# Patient Record
Sex: Female | Born: 1960 | Race: Black or African American | Hispanic: No | Marital: Married | Smoking: Never smoker
Health system: Southern US, Community
[De-identification: ages and names within clinical notes are randomized; demographics above are authoritative.]

## PROBLEM LIST (undated history)

## (undated) DIAGNOSIS — I10 Essential (primary) hypertension: Secondary | ICD-10-CM

## (undated) DIAGNOSIS — M1990 Unspecified osteoarthritis, unspecified site: Secondary | ICD-10-CM

## (undated) HISTORY — PX: ECTOPIC PREGNANCY SURGERY: SHX613

## (undated) HISTORY — PX: TONSILLECTOMY: SUR1361

## (undated) HISTORY — DX: Unspecified osteoarthritis, unspecified site: M19.90

## (undated) HISTORY — PX: TUBAL LIGATION: SHX77

---

## 1999-12-02 ENCOUNTER — Encounter: Admission: RE | Admit: 1999-12-02 | Discharge: 1999-12-02 | Payer: Self-pay | Admitting: Obstetrics & Gynecology

## 2000-01-09 ENCOUNTER — Ambulatory Visit (HOSPITAL_COMMUNITY): Admission: RE | Admit: 2000-01-09 | Discharge: 2000-01-09 | Payer: Self-pay | Admitting: Obstetrics & Gynecology

## 2000-10-20 ENCOUNTER — Encounter: Payer: Self-pay | Admitting: Infectious Diseases

## 2000-10-20 ENCOUNTER — Ambulatory Visit (HOSPITAL_COMMUNITY): Admission: RE | Admit: 2000-10-20 | Discharge: 2000-10-20 | Payer: Self-pay | Admitting: Infectious Diseases

## 2011-08-21 ENCOUNTER — Other Ambulatory Visit (HOSPITAL_COMMUNITY)
Admission: RE | Admit: 2011-08-21 | Discharge: 2011-08-21 | Disposition: A | Discharge status: Home / Self Care | Payer: PRIVATE HEALTH INSURANCE | Source: Ambulatory Visit | Attending: Family Medicine | Admitting: Family Medicine

## 2011-08-21 ENCOUNTER — Other Ambulatory Visit: Payer: Self-pay | Admitting: Family Medicine

## 2011-08-21 DIAGNOSIS — Z780 Asymptomatic menopausal state: Secondary | ICD-10-CM

## 2011-08-21 DIAGNOSIS — N644 Mastodynia: Secondary | ICD-10-CM

## 2011-08-21 DIAGNOSIS — Z0000 Encounter for general adult medical examination without abnormal findings: Secondary | ICD-10-CM | POA: Insufficient documentation

## 2012-11-07 ENCOUNTER — Encounter (HOSPITAL_COMMUNITY): Payer: Self-pay | Admitting: Emergency Medicine

## 2012-11-07 ENCOUNTER — Emergency Department (HOSPITAL_COMMUNITY)
Admission: EM | Admit: 2012-11-07 | Discharge: 2012-11-07 | Disposition: A | Discharge status: Home / Self Care | Payer: BC Managed Care – PPO | Attending: Emergency Medicine | Admitting: Emergency Medicine

## 2012-11-07 DIAGNOSIS — Z79899 Other long term (current) drug therapy: Secondary | ICD-10-CM | POA: Insufficient documentation

## 2012-11-07 DIAGNOSIS — T465X1A Poisoning by other antihypertensive drugs, accidental (unintentional), initial encounter: Secondary | ICD-10-CM | POA: Insufficient documentation

## 2012-11-07 DIAGNOSIS — T783XXA Angioneurotic edema, initial encounter: Secondary | ICD-10-CM

## 2012-11-07 DIAGNOSIS — T465X5A Adverse effect of other antihypertensive drugs, initial encounter: Secondary | ICD-10-CM | POA: Insufficient documentation

## 2012-11-07 DIAGNOSIS — I10 Essential (primary) hypertension: Secondary | ICD-10-CM | POA: Insufficient documentation

## 2012-11-07 HISTORY — DX: Essential (primary) hypertension: I10

## 2012-11-07 MED ORDER — HYDROCHLOROTHIAZIDE 12.5 MG PO TABS: 25.0000 mg | ORAL_TABLET | Freq: Every day | ORAL | Status: DC

## 2012-11-07 NOTE — ED Notes (Signed)
Pt in c/o lip swelling since this am, pt does take lisinopril, states she took benadryl earlier today with no relief, back of throat easily visualized at this time, pt denies tongue or throat swelling

## 2012-11-07 NOTE — ED Provider Notes (Signed)
CSN: [id number]     Arrival date & time 11/07/12  1644 History   First MD Initiated Contact with Patient 11/07/12 1647     Chief Complaint  Patient presents with  . Angioedema   (Consider location/radiation/quality/duration/timing/severity/associated sxs/prior Treatment) HPI This patient presents with painless swelling of the upper lip.  Symptoms began approximately 9 hours ago without clear precipitant.  Since onset symptoms have progressed to include the entire upper lip.  There is no associated dyspnea, foot pain or fullness.  No lightheadedness, syncope, no dysphagia. Patient has taken Benadryl with no relief. Patient took her lisinopril approximately 3 hours ago. No other complaints. No Hx of similar developments.   Past Medical History  Diagnosis Date  . Hypertension    History reviewed. No pertinent past surgical history. History reviewed. No pertinent family history. History  Substance Use Topics  . Smoking status: Never Smoker   . Smokeless tobacco: Not on file  . Alcohol Use: Not on file   OB History   Grav Para Term Preterm Abortions TAB SAB Ect Mult Living                 Review of Systems  Constitutional:       Per HPI, otherwise negative  HENT:       Per HPI, otherwise negative  Respiratory:       Per HPI, otherwise negative  Cardiovascular:       Per HPI, otherwise negative  Gastrointestinal: Negative for nausea and vomiting.  Endocrine:       Negative aside from HPI  Genitourinary:       Neg aside from HPI   Musculoskeletal:       Per HPI, otherwise negative  Skin: Negative.   Neurological: Negative for syncope.    Allergies  Review of patient's allergies indicates no known allergies.  Home Medications  No current outpatient prescriptions on file. BP 133/80  Pulse 96  Temp(Src) 98 F (36.7 C) (Oral)  Resp 18  SpO2 99% Physical Exam  Nursing note and vitals reviewed. Constitutional: She is oriented to person, place, and time. She  appears well-developed and well-nourished. No distress.  HENT:  Head: Normocephalic and atraumatic.  Mouth/Throat: Uvula is midline and oropharynx is clear and moist. No oral lesions. No trismus in the jaw. No dental abscesses, uvula swelling or lacerations. No oropharyngeal exudate, posterior oropharyngeal edema, posterior oropharyngeal erythema or tonsillar abscesses.    Eyes: Conjunctivae and EOM are normal.  Neck: Neck supple. No rigidity. No tracheal deviation, no edema, no erythema and normal range of motion present.  Cardiovascular: Normal rate and regular rhythm.   Pulmonary/Chest: Effort normal and breath sounds normal. No stridor. No respiratory distress.  Abdominal: She exhibits no distension.  Musculoskeletal: She exhibits no edema.  Lymphadenopathy:       Right cervical: No superficial cervical and no deep cervical adenopathy present.      Left cervical: No superficial cervical and no deep cervical adenopathy present.  Neurological: She is alert and oriented to person, place, and time. No cranial nerve deficit.  Skin: Skin is warm and dry.  Psychiatric: She has a normal mood and affect.    ED Course  Procedures (including critical care time) Labs Review Labs Reviewed - No data to display Imaging Review No results found.  EKG Interpretation   None      O2- 99%ra, normal MDM   1. Angioedema of lips, initial encounter    Patient presents with hours of edema  in her upper lip, with no respiratory complaints.  The patient is a taking lisinopril.  Patient has no history of prior event, but given her presentation, this event is most consistent with ACE inhibitor related angioedema. Given the passage of the hours since the onset of symptoms and with no decompensation, no throat complaints, no dyspnea, there is low suspicion for impending respiratory compromise.  Explicit return precautions were provided. Patient was counseled on the necessity to stop this medication, to  followup with her primary care physician for discussion of new appropriate antihypertensives, and she was discharged in stable condition.     Gerhard Munch, MD 11/07/12 [phone]

## 2012-12-25 ENCOUNTER — Encounter (HOSPITAL_COMMUNITY): Payer: Self-pay | Admitting: Emergency Medicine

## 2012-12-25 ENCOUNTER — Emergency Department (HOSPITAL_COMMUNITY)
Admission: EM | Admit: 2012-12-25 | Discharge: 2012-12-25 | Disposition: A | Discharge status: Home / Self Care | Payer: BC Managed Care – PPO | Attending: Emergency Medicine | Admitting: Emergency Medicine

## 2012-12-25 DIAGNOSIS — R209 Unspecified disturbances of skin sensation: Secondary | ICD-10-CM | POA: Insufficient documentation

## 2012-12-25 DIAGNOSIS — Z79899 Other long term (current) drug therapy: Secondary | ICD-10-CM | POA: Insufficient documentation

## 2012-12-25 DIAGNOSIS — R200 Anesthesia of skin: Secondary | ICD-10-CM

## 2012-12-25 DIAGNOSIS — I10 Essential (primary) hypertension: Secondary | ICD-10-CM | POA: Insufficient documentation

## 2012-12-25 DIAGNOSIS — Z87891 Personal history of nicotine dependence: Secondary | ICD-10-CM | POA: Insufficient documentation

## 2012-12-25 LAB — POCT I-STAT, CHEM 8
BUN: 12 mg/dL (ref 6–23)
Calcium, Ion: 1.2 mmol/L (ref 1.12–1.23)
Chloride: 101 mEq/L (ref 96–112)
Creatinine, Ser: 0.8 mg/dL (ref 0.50–1.10)
Glucose, Bld: 100 mg/dL — ABNORMAL HIGH (ref 70–99)
HCT: 40 % (ref 36.0–46.0)

## 2012-12-25 MED ORDER — ASPIRIN EC 81 MG PO TBEC: 81.0000 mg | DELAYED_RELEASE_TABLET | Freq: Every day | ORAL | Status: DC

## 2012-12-25 MED ORDER — HYDROCHLOROTHIAZIDE 25 MG PO TABS: 25.0000 mg | ORAL_TABLET | Freq: Every day | ORAL | Status: DC

## 2012-12-25 NOTE — ED Provider Notes (Signed)
CSN: [id number]     Arrival date & time 12/25/12  1200 History   First MD Initiated Contact with Patient 12/25/12 1214     Chief Complaint  Patient presents with  . Numbness   (Consider location/radiation/quality/duration/timing/severity/associated sxs/prior Treatment) HPI Comments: 52 y/o F comes in with cc if numbness, tingling. Pt states that around 10:30, she started having bilateral hands and leg numbness, and tingling. She took her BP, and it was elevated - in the 160s SBP - and her sx didn't improve, so she decided to come to the ER. The sx have now resolved. She has no hx of strokes, she doesn't smoke, and she denies any headaches, neck or back pain. No hx of similar sx, or any other unexplained focal neuro deficits in the past, and no hx of MS in the family.   The history is provided by the patient.    Past Medical History  Diagnosis Date  . Hypertension    Past Surgical History  Procedure Laterality Date  . Ectopic pregnancy surgery    . Tubal ligation     History reviewed. No pertinent family history. History  Substance Use Topics  . Smoking status: Former Smoker    Quit date: 12/26/1978  . Smokeless tobacco: Never Used  . Alcohol Use: Yes     Comment: ocassionally   OB History   Grav Para Term Preterm Abortions TAB SAB Ect Mult Living                 Review of Systems  Constitutional: Negative for chills and activity change.  HENT: Negative for facial swelling.   Eyes: Negative for visual disturbance.  Respiratory: Negative for cough, shortness of breath and wheezing.   Cardiovascular: Negative for chest pain.  Gastrointestinal: Negative for nausea, vomiting, abdominal pain, diarrhea and abdominal distention.  Genitourinary: Negative for dysuria and difficulty urinating.  Musculoskeletal: Negative for neck pain.  Skin: Negative for color change.  Neurological: Positive for numbness. Negative for dizziness, facial asymmetry, speech difficulty, weakness,  light-headedness and headaches.  Hematological: Does not bruise/bleed easily.  Psychiatric/Behavioral: Negative for confusion.    Allergies  Codeine and Lisinopril  Home Medications   Current Outpatient Rx  Name  Route  Sig  Dispense  Refill  . Ascorbic Acid (VITAMIN C) 1000 MG tablet   Oral   Take 1,000 mg by mouth daily.         . diphenhydrAMINE (BENADRYL) 25 mg capsule   Oral   Take 50 mg by mouth every 6 (six) hours as needed.         Marland Kitchen ECHINACEA PO   Oral   Take by mouth.         Marland Kitchen echothiophate 0.125 % ophthalmic solution      1 drop.         . hydrochlorothiazide (HYDRODIURIL) 12.5 MG tablet   Oral   Take 12.5 mg by mouth daily.         Marland Kitchen ibuprofen (ADVIL,MOTRIN) 200 MG tablet   Oral   Take 200 mg by mouth every 6 (six) hours as needed for fever, headache or mild pain.         . Multiple Vitamins-Minerals (MULTIVITAMIN PO)   Oral   Take 1 tablet by mouth daily.          BP 158/99  Pulse 91  Temp(Src) 98.4 F (36.9 C) (Oral)  Resp 16  SpO2 100% Physical Exam  Nursing note and vitals reviewed. Constitutional: She  is oriented to person, place, and time. She appears well-developed and well-nourished.  HENT:  Head: Normocephalic and atraumatic.  Eyes: EOM are normal. Pupils are equal, round, and reactive to light.  Neck: Neck supple.  Cardiovascular: Normal rate, regular rhythm and normal heart sounds.   No murmur heard. Pulmonary/Chest: Effort normal. No respiratory distress.  Abdominal: Soft. She exhibits no distension. There is no tenderness. There is no rebound and no guarding.  Neurological: She is alert and oriented to person, place, and time.  Cerebellar exam is normal (finger to nose) Sensory exam normal for bilateral upper and lower extremities - and patient is able to discriminate between sharp and dull. Motor exam is 4+/5   Skin: Skin is warm and dry.    ED Course  Procedures (including critical care time) Labs  Review Labs Reviewed  POCT I-STAT, CHEM 8 - Abnormal; Notable for the following:    Glucose, Bld 100 (*)    All other components within normal limits   Imaging Review No results found.  EKG Interpretation   None       MDM  No diagnosis found.  Pt comes in with bilateral numbness. She has hx of HTN, but no other cardiovascular risk factors. Her sx were bilateral, episodic, and she ha no headache, focal neuro defictis, neck pain/back pain. Unsure what the cause of her sx are. I screen for MS on hx - and she denies any vague neuro complains in the past. Will d/c with pcp f/u. Chem 8 ordered  - and there is no elyte abn.   Derwood Kaplan, MD 12/25/12 1420

## 2012-12-25 NOTE — ED Notes (Signed)
Pt presents to ED with numbness and tingling in hands and fingers after being without BP medications for one week.  Pt reports BP of 160/100 at home but went to work; pt had some nausea initially but no vomiting/ pt ate some peanut butter and nausea symptoms went away.

## 2013-03-22 ENCOUNTER — Emergency Department (HOSPITAL_COMMUNITY)
Admission: EM | Admit: 2013-03-22 | Discharge: 2013-03-22 | Disposition: A | Discharge status: Home / Self Care | Payer: BC Managed Care – PPO | Source: Home / Self Care | Attending: Family Medicine | Admitting: Family Medicine

## 2013-03-22 ENCOUNTER — Encounter (HOSPITAL_COMMUNITY): Payer: Self-pay | Admitting: Emergency Medicine

## 2013-03-22 DIAGNOSIS — I10 Essential (primary) hypertension: Secondary | ICD-10-CM

## 2013-03-22 MED ORDER — HYDROCHLOROTHIAZIDE 25 MG PO TABS: 25.0000 mg | ORAL_TABLET | Freq: Every day | ORAL | Status: DC

## 2013-03-22 NOTE — ED Notes (Signed)
Record not available, registration in progress

## 2013-03-22 NOTE — ED Provider Notes (Signed)
CSN: [id number]     Arrival date & time 03/22/13  0902 History   First MD Initiated Contact with Patient 03/22/13 [phone]     Chief Complaint  Patient presents with  . Hypertension  . Medication Refill   (Consider location/radiation/quality/duration/timing/severity/associated sxs/prior Treatment) HPI Comments: Hs recently run out of her HCTZ and she cannot afford to see her PCP for refills. Presents to [facility] requesting Rx refill. Reports herself to be currently asymptomatic.   Patient is a 53 y.o. female presenting with hypertension. The history is provided by the patient.  Hypertension    Past Medical History  Diagnosis Date  . Hypertension    Past Surgical History  Procedure Laterality Date  . Ectopic pregnancy surgery    . Tubal ligation     No family history on file. History  Substance Use Topics  . Smoking status: Former Smoker    Quit date: 12/26/1978  . Smokeless tobacco: Never Used  . Alcohol Use: Yes     Comment: ocassionally   OB History   Grav Para Term Preterm Abortions TAB SAB Ect Mult Living                 Review of Systems  All other systems reviewed and are negative.    Allergies  Codeine and Lisinopril  Home Medications   Current Outpatient Rx  Name  Route  Sig  Dispense  Refill  . hydrochlorothiazide (HYDRODIURIL) 25 MG tablet   Oral   Take 1 tablet (25 mg total) by mouth daily.   30 tablet   1   . Ascorbic Acid (VITAMIN C) 1000 MG tablet   Oral   Take 1,000 mg by mouth daily.         Marland Kitchen aspirin EC 81 MG tablet   Oral   Take 1 tablet (81 mg total) by mouth daily.   90 tablet   1   . diphenhydrAMINE (BENADRYL) 25 mg capsule   Oral   Take 50 mg by mouth every 6 (six) hours as needed.         Marland Kitchen ECHINACEA PO   Oral   Take by mouth.         Marland Kitchen echothiophate 0.125 % ophthalmic solution      1 drop.         . hydrochlorothiazide (HYDRODIURIL) 12.5 MG tablet   Oral   Take 12.5 mg by mouth daily.         .  hydrochlorothiazide (HYDRODIURIL) 25 MG tablet   Oral   Take 1 tablet (25 mg total) by mouth daily.   30 tablet   1   . ibuprofen (ADVIL,MOTRIN) 200 MG tablet   Oral   Take 200 mg by mouth every 6 (six) hours as needed for fever, headache or mild pain.         . Multiple Vitamins-Minerals (MULTIVITAMIN PO)   Oral   Take 1 tablet by mouth daily.          BP 161/79  Pulse 74  Temp(Src) 97.4 F (36.3 C) (Oral)  Resp 20  SpO2 100% Physical Exam  Nursing note and vitals reviewed. Constitutional: She is oriented to person, place, and time. She appears well-developed and well-nourished. No distress.  HENT:  Head: Normocephalic and atraumatic.  Eyes: Conjunctivae are normal. No scleral icterus.  Neck: Normal range of motion. Neck supple. No JVD present.  Cardiovascular: Normal rate, regular rhythm and normal heart sounds.   Pulmonary/Chest: Effort normal and breath  sounds normal.  Musculoskeletal: She exhibits no edema.  Neurological: She is alert and oriented to person, place, and time.  Skin: Skin is warm and dry.  Psychiatric: She has a normal mood and affect. Her behavior is normal.    ED Course  Procedures (including critical care time) Labs Review Labs Reviewed - No data to display Imaging Review No results found.   MDM   1. Hypertension    Limited Rx refill provided. Advised PCP follow up when possible.   Jess BartersJennifer Lee [place], [place] 03/22/13 [phone]

## 2013-03-22 NOTE — ED Notes (Signed)
Patient has history of htn.  Running out of medicine and has not taken medicine every day.  Last medicine taken was on Thursday, no medicine on Friday or today

## 2013-03-22 NOTE — Discharge Instructions (Signed)
Arterial Hypertension  Arterial hypertension (high blood pressure) is a condition of elevated pressure in your blood vessels. Hypertension over a long period of time is a risk factor for strokes, heart attacks, and heart failure. It is also the leading cause of kidney (renal) failure.   CAUSES   · In Adults -- Over 90% of all hypertension has no known cause. This is called essential or primary hypertension. In the other 10% of people with hypertension, the increase in blood pressure is caused by another disorder. This is called secondary hypertension. Important causes of secondary hypertension are:  · Heavy alcohol use.  · Obstructive sleep apnea.  · Hyperaldosterosim (Conn's syndrome).  · Steroid use.  · Chronic kidney failure.  · Hyperparathyroidism.  · Medications.  · Renal artery stenosis.  · Pheochromocytoma.  · Cushing's disease.  · Coarctation of the aorta.  · Scleroderma renal crisis.  · Licorice (in excessive amounts).  · Drugs (cocaine, methamphetamine).  Your caregiver can explain any items above that apply to you.  · In Children -- Secondary hypertension is more common and should always be considered.  · Pregnancy -- Few women of childbearing age have high blood pressure. However, up to 10% of them develop hypertension of pregnancy. Generally, this will not harm the woman. It may be a sign of 3 complications of pregnancy: preeclampsia, HELLP syndrome, and eclampsia. Follow up and control with medication is necessary.  SYMPTOMS   · This condition normally does not produce any noticeable symptoms. It is usually found during a routine exam.  · Malignant hypertension is a late problem of high blood pressure. It may have the following symptoms:  · Headaches.  · Blurred vision.  · End-organ damage (this means your kidneys, heart, lungs, and other organs are being damaged).  · Stressful situations can increase the blood pressure. If a person with normal blood pressure has their blood pressure go up while being  seen by their caregiver, this is often termed "white coat hypertension." Its importance is not known. It may be related with eventually developing hypertension or complications of hypertension.  · Hypertension is often confused with mental tension, stress, and anxiety.  DIAGNOSIS   The diagnosis is made by 3 separate blood pressure measurements. They are taken at least 1 week apart from each other. If there is organ damage from hypertension, the diagnosis may be made without repeat measurements.  Hypertension is usually identified by having blood pressure readings:  · Above 140/90 mmHg measured in both arms, at 3 separate times, over a couple weeks.  · Over 130/80 mmHg should be considered a risk factor and may require treatment in patients with diabetes.  Blood pressure readings over 120/80 mmHg are called "pre-hypertension" even in non-diabetic patients.  To get a true blood pressure measurement, use the following guidelines. Be aware of the factors that can alter blood pressure readings.  · Take measurements at least 1 hour after caffeine.  · Take measurements 30 minutes after smoking and without any stress. This is another reason to quit smoking  it raises your blood pressure.  · Use a proper cuff size. Ask your caregiver if you are not sure about your cuff size.  · Most home blood pressure cuffs are automatic. They will measure systolic and diastolic pressures. The systolic pressure is the pressure reading at the start of sounds. Diastolic pressure is the pressure at which the sounds disappear. If you are elderly, measure pressures in multiple postures. Try sitting, lying or   standing.  · Sit at rest for a minimum of 5 minutes before taking measurements.  · You should not be on any medications like decongestants. These are found in many cold medications.  · Record your blood pressure readings and review them with your caregiver.  If you have hypertension:  · Your caregiver may do tests to be sure you do not have  secondary hypertension (see "causes" above).  · Your caregiver may also look for signs of metabolic syndrome. This is also called Syndrome X or Insulin Resistance Syndrome. You may have this syndrome if you have type 2 diabetes, abdominal obesity, and abnormal blood lipids in addition to hypertension.  · Your caregiver will take your medical and family history and perform a physical exam.  · Diagnostic tests may include blood tests (for glucose, cholesterol, potassium, and kidney function), a urinalysis, or an EKG. Other tests may also be necessary depending on your condition.  PREVENTION   There are important lifestyle issues that you can adopt to reduce your chance of developing hypertension:  · Maintain a normal weight.  · Limit the amount of salt (sodium) in your diet.  · Exercise often.  · Limit alcohol intake.  · Get enough potassium in your diet. Discuss specific advice with your caregiver.  · Follow a DASH diet (dietary approaches to stop hypertension). This diet is rich in fruits, vegetables, and low-fat dairy products, and avoids certain fats.  PROGNOSIS   Essential hypertension cannot be cured. Lifestyle changes and medical treatment can lower blood pressure and reduce complications. The prognosis of secondary hypertension depends on the underlying cause. Many people whose hypertension is controlled with medicine or lifestyle changes can live a normal, healthy life.   RISKS AND COMPLICATIONS   While high blood pressure alone is not an illness, it often requires treatment due to its short- and long-term effects on many organs. Hypertension increases your risk for:  · CVAs or strokes (cerebrovascular accident).  · Heart failure due to chronically high blood pressure (hypertensive cardiomyopathy).  · Heart attack (myocardial infarction).  · Damage to the retina (hypertensive retinopathy).  · Kidney failure (hypertensive nephropathy).  Your caregiver can explain list items above that apply to you. Treatment  of hypertension can significantly reduce the risk of complications.  TREATMENT   · For overweight patients, weight loss and regular exercise are recommended. Physical fitness lowers blood pressure.  · Mild hypertension is usually treated with diet and exercise. A diet rich in fruits and vegetables, fat-free dairy products, and foods low in fat and salt (sodium) can help lower blood pressure. Decreasing salt intake decreases blood pressure in a 1/3 of people.  · Stop smoking if you are a smoker.  The steps above are highly effective in reducing blood pressure. While these actions are easy to suggest, they are difficult to achieve. Most patients with moderate or severe hypertension end up requiring medications to bring their blood pressure down to a normal level. There are several classes of medications for treatment. Blood pressure pills (antihypertensives) will lower blood pressure by their different actions. Lowering the blood pressure by 10 mmHg may decrease the risk of complications by as much as 25%.  The goal of treatment is effective blood pressure control. This will reduce your risk for complications. Your caregiver will help you determine the best treatment for you according to your lifestyle. What is excellent treatment for one person, may not be for you.  HOME CARE INSTRUCTIONS   · Do not   smoke.  · Follow the lifestyle changes outlined in the "Prevention" section.  · If you are on medications, follow the directions carefully. Blood pressure medications must be taken as prescribed. Skipping doses reduces their benefit. It also puts you at risk for problems.  · Follow up with your caregiver, as directed.  · If you are asked to monitor your blood pressure at home, follow the guidelines in the "Diagnosis" section above.  SEEK MEDICAL CARE IF:   · You think you are having medication side effects.  · You have recurrent headaches or lightheadedness.  · You have swelling in your ankles.  · You have trouble with  your vision.  SEEK IMMEDIATE MEDICAL CARE IF:   · You have sudden onset of chest pain or pressure, difficulty breathing, or other symptoms of a heart attack.  · You have a severe headache.  · You have symptoms of a stroke (such as sudden weakness, difficulty speaking, difficulty walking).  MAKE SURE YOU:   · Understand these instructions.  · Will watch your condition.  · Will get help right away if you are not doing well or get worse.  Document Released: 12/19/2004 Document Revised: 03/13/2011 Document Reviewed: 07/19/2006  ExitCare® Patient Information ©2014 [employer].  DASH Diet  The DASH diet stands for "Dietary Approaches to Stop Hypertension." It is a healthy eating plan that has been shown to reduce high blood pressure (hypertension) in as little as 14 days, while also possibly providing other significant health benefits. These other health benefits include reducing the risk of breast cancer after menopause and reducing the risk of type 2 diabetes, heart disease, colon cancer, and stroke. Health benefits also include weight loss and slowing kidney failure in patients with chronic kidney disease.   DIET GUIDELINES  · Limit salt (sodium). Your diet should contain less than 1500 mg of sodium daily.  · Limit refined or processed carbohydrates. Your diet should include mostly whole grains. Desserts and added sugars should be used sparingly.  · Include small amounts of heart-healthy fats. These types of fats include nuts, oils, and tub margarine. Limit saturated and trans fats. These fats have been shown to be harmful in the body.  CHOOSING FOODS   The following food groups are based on a 2000 calorie diet. See your Registered Dietitian for individual calorie needs.  Grains and Grain Products (6 to 8 servings daily)  · Eat More Often: Whole-wheat bread, brown rice, whole-grain or wheat pasta, quinoa, popcorn without added fat or salt (air popped).  · Eat Less Often: White bread, white pasta, white rice,  cornbread.  Vegetables (4 to 5 servings daily)  · Eat More Often: Fresh, frozen, and canned vegetables. Vegetables may be raw, steamed, roasted, or grilled with a minimal amount of fat.  · Eat Less Often/Avoid: Creamed or fried vegetables. Vegetables in a cheese sauce.  Fruit (4 to 5 servings daily)  · Eat More Often: All fresh, canned (in natural juice), or frozen fruits. Dried fruits without added sugar. One hundred percent fruit juice (½ cup [237 mL] daily).  · Eat Less Often: Dried fruits with added sugar. Canned fruit in light or heavy syrup.  Lean Meats, Fish, and Poultry (2 servings or less daily. One serving is 3 to 4 oz [85-114 g]).  · Eat More Often: Ninety percent or leaner ground beef, tenderloin, sirloin. Round cuts of beef, chicken breast, turkey breast. All fish. Grill, bake, or broil your meat. Nothing should be fried.  · Eat   Less Often/Avoid: Fatty cuts of meat, turkey, or chicken leg, thigh, or wing. Fried cuts of meat or fish.  Dairy (2 to 3 servings)  · Eat More Often: Low-fat or fat-free milk, low-fat plain or light yogurt, reduced-fat or part-skim cheese.  · Eat Less Often/Avoid: Milk (whole, 2%). Whole milk yogurt. Full-fat cheeses.  Nuts, Seeds, and Legumes (4 to 5 servings per week)  · Eat More Often: All without added salt.  · Eat Less Often/Avoid: Salted nuts and seeds, canned beans with added salt.  Fats and Sweets (limited)  · Eat More Often: Vegetable oils, tub margarines without trans fats, sugar-free gelatin. Mayonnaise and salad dressings.  · Eat Less Often/Avoid: Coconut oils, palm oils, butter, stick margarine, cream, half and half, cookies, candy, pie.  FOR MORE INFORMATION  The Dash Diet Eating Plan: www.dashdiet.org  Document Released: 12/08/2010 Document Revised: 03/13/2011 Document Reviewed: 12/08/2010  ExitCare® Patient Information ©2014 [employer].

## 2013-03-24 NOTE — ED Provider Notes (Signed)
Medical screening examination/treatment/procedure(s) were performed by a resident physician or non-physician practitioner and as the supervising physician I was immediately available for consultation/collaboration.  Aladdin Kollmann, MD    Ceilidh Torregrossa S Cherrelle Plante, MD 03/24/13 0805 

## 2018-01-11 DIAGNOSIS — M130 Polyarthritis, unspecified: Secondary | ICD-10-CM | POA: Diagnosis not present

## 2018-01-11 DIAGNOSIS — I10 Essential (primary) hypertension: Secondary | ICD-10-CM | POA: Diagnosis not present

## 2018-02-15 DIAGNOSIS — I10 Essential (primary) hypertension: Secondary | ICD-10-CM | POA: Diagnosis not present

## 2018-02-22 DIAGNOSIS — I10 Essential (primary) hypertension: Secondary | ICD-10-CM | POA: Diagnosis not present

## 2018-03-22 DIAGNOSIS — I10 Essential (primary) hypertension: Secondary | ICD-10-CM | POA: Diagnosis not present

## 2018-04-02 DIAGNOSIS — I10 Essential (primary) hypertension: Secondary | ICD-10-CM | POA: Diagnosis not present

## 2018-05-03 DIAGNOSIS — I10 Essential (primary) hypertension: Secondary | ICD-10-CM | POA: Diagnosis not present

## 2018-06-21 DIAGNOSIS — Z0131 Encounter for examination of blood pressure with abnormal findings: Secondary | ICD-10-CM | POA: Diagnosis not present

## 2018-07-12 DIAGNOSIS — I10 Essential (primary) hypertension: Secondary | ICD-10-CM | POA: Diagnosis not present

## 2018-08-26 ENCOUNTER — Other Ambulatory Visit: Payer: Self-pay

## 2018-08-26 DIAGNOSIS — Z20822 Contact with and (suspected) exposure to covid-19: Secondary | ICD-10-CM

## 2018-08-27 LAB — NOVEL CORONAVIRUS, NAA: SARS-CoV-2, NAA: NOT DETECTED

## 2018-10-11 DIAGNOSIS — Z1231 Encounter for screening mammogram for malignant neoplasm of breast: Secondary | ICD-10-CM | POA: Diagnosis not present

## 2018-10-18 DIAGNOSIS — I10 Essential (primary) hypertension: Secondary | ICD-10-CM | POA: Diagnosis not present

## 2018-10-18 DIAGNOSIS — M130 Polyarthritis, unspecified: Secondary | ICD-10-CM | POA: Diagnosis not present

## 2018-10-18 DIAGNOSIS — R635 Abnormal weight gain: Secondary | ICD-10-CM | POA: Diagnosis not present

## 2018-12-19 DIAGNOSIS — S161XXA Strain of muscle, fascia and tendon at neck level, initial encounter: Secondary | ICD-10-CM | POA: Diagnosis not present

## 2018-12-19 DIAGNOSIS — S39012A Strain of muscle, fascia and tendon of lower back, initial encounter: Secondary | ICD-10-CM | POA: Diagnosis not present

## 2018-12-19 DIAGNOSIS — M542 Cervicalgia: Secondary | ICD-10-CM | POA: Diagnosis not present

## 2019-01-02 DIAGNOSIS — S161XXD Strain of muscle, fascia and tendon at neck level, subsequent encounter: Secondary | ICD-10-CM | POA: Diagnosis not present

## 2019-01-02 DIAGNOSIS — S29012D Strain of muscle and tendon of back wall of thorax, subsequent encounter: Secondary | ICD-10-CM | POA: Diagnosis not present

## 2019-01-08 DIAGNOSIS — Z03818 Encounter for observation for suspected exposure to other biological agents ruled out: Secondary | ICD-10-CM | POA: Diagnosis not present

## 2019-01-15 DIAGNOSIS — S161XXD Strain of muscle, fascia and tendon at neck level, subsequent encounter: Secondary | ICD-10-CM | POA: Diagnosis not present

## 2019-01-15 DIAGNOSIS — S29012D Strain of muscle and tendon of back wall of thorax, subsequent encounter: Secondary | ICD-10-CM | POA: Diagnosis not present

## 2019-01-30 DIAGNOSIS — S161XXD Strain of muscle, fascia and tendon at neck level, subsequent encounter: Secondary | ICD-10-CM | POA: Diagnosis not present

## 2019-01-30 DIAGNOSIS — S29012D Strain of muscle and tendon of back wall of thorax, subsequent encounter: Secondary | ICD-10-CM | POA: Diagnosis not present

## 2019-02-10 DIAGNOSIS — Z1159 Encounter for screening for other viral diseases: Secondary | ICD-10-CM | POA: Diagnosis not present

## 2019-02-21 DIAGNOSIS — G4700 Insomnia, unspecified: Secondary | ICD-10-CM | POA: Diagnosis not present

## 2019-02-21 DIAGNOSIS — I10 Essential (primary) hypertension: Secondary | ICD-10-CM | POA: Diagnosis not present

## 2019-02-21 DIAGNOSIS — M130 Polyarthritis, unspecified: Secondary | ICD-10-CM | POA: Diagnosis not present

## 2019-03-06 DIAGNOSIS — S29012D Strain of muscle and tendon of back wall of thorax, subsequent encounter: Secondary | ICD-10-CM | POA: Diagnosis not present

## 2019-03-06 DIAGNOSIS — S161XXD Strain of muscle, fascia and tendon at neck level, subsequent encounter: Secondary | ICD-10-CM | POA: Diagnosis not present

## 2019-05-30 DIAGNOSIS — Z01419 Encounter for gynecological examination (general) (routine) without abnormal findings: Secondary | ICD-10-CM | POA: Diagnosis not present

## 2019-05-30 DIAGNOSIS — Z6828 Body mass index (BMI) 28.0-28.9, adult: Secondary | ICD-10-CM | POA: Diagnosis not present

## 2019-06-17 DIAGNOSIS — Z13820 Encounter for screening for osteoporosis: Secondary | ICD-10-CM | POA: Diagnosis not present

## 2019-06-17 DIAGNOSIS — R140 Abdominal distension (gaseous): Secondary | ICD-10-CM | POA: Diagnosis not present

## 2019-06-27 DIAGNOSIS — I10 Essential (primary) hypertension: Secondary | ICD-10-CM | POA: Diagnosis not present

## 2019-07-16 ENCOUNTER — Ambulatory Visit (INDEPENDENT_AMBULATORY_CARE_PROVIDER_SITE_OTHER): Payer: BC Managed Care – PPO | Admitting: Gastroenterology

## 2019-07-16 ENCOUNTER — Encounter: Payer: Self-pay | Admitting: Gastroenterology

## 2019-07-16 VITALS — BP 142/86 | HR 84 | Ht 63.5 in | Wt 167.0 lb

## 2019-07-16 DIAGNOSIS — R194 Change in bowel habit: Secondary | ICD-10-CM | POA: Insufficient documentation

## 2019-07-16 DIAGNOSIS — Z1211 Encounter for screening for malignant neoplasm of colon: Secondary | ICD-10-CM | POA: Diagnosis not present

## 2019-07-16 MED ORDER — SUTAB 1479-225-188 MG PO TABS: 1.0000 | ORAL_TABLET | Freq: Once | ORAL | 0 refills | Status: AC

## 2019-07-16 NOTE — Progress Notes (Signed)
07/16/2019 Jade Guzman [id number] 1960/06/23   HISTORY OF PRESENT ILLNESS: This is a 59 year old female who is new to our office.  She has been referred here by her GYN, Arlie Solomons, NP, for evaluation regarding abdominal pain, abdominal bloating, and "distended colon" seen on ultrasound.  She said that she went for her routine exam and was telling nurse practitioner about a lot of gas and generalized abdominal discomfort/bloating.  She says that over the past year she's had a change in bowel habits.  She says that she has normal, formed stools, but every time that she sits down to urinate she passes some stool.  Feels like she never completely empties her bowels all at one time.  Denies any sign of bleeding.  She says that they sent her for an ultrasound (we do not have records) and they reported a "distended colon".  She says that they recommended Metamucil and that she use it for short time and did seem to notice some change/improvement, but admits that she is not good about using stuff like that consistently for long periods of time.  She tells me that she had a colonoscopy with Dr. Randa Evens at [facility] about 6.5 to 7 years ago.  She was told that it was good and should not need another one for 10 years.    Past Medical History:  Diagnosis Date  . Arthritis   . Hypertension    Past Surgical History:  Procedure Laterality Date  . ECTOPIC PREGNANCY SURGERY    . TONSILLECTOMY    . TUBAL LIGATION      reports that she has never smoked. She has never used smokeless tobacco. She reports current alcohol use. She reports that she does not use drugs. family history includes Alcoholism in her mother; Breast cancer in her cousin; Drug abuse in her brother; Heart attack in her mother; Hypertension in her maternal grandfather, maternal grandmother, and mother; Sarcoidosis in her brother; Stroke in her mother. Allergies  Allergen Reactions  . Codeine Other (See Comments)    Hallucinations   . Lisinopril Swelling      Outpatient Encounter Medications as of 07/16/2019  Medication Sig  . amLODipine (NORVASC) 2.5 MG tablet Take 2.5 mg by mouth at bedtime.  . Ascorbic Acid (VITAMIN C) 1000 MG tablet Take 1,000 mg by mouth daily.  Marland Kitchen aspirin EC 81 MG tablet Take 1 tablet (81 mg total) by mouth daily.  Marland Kitchen ECHINACEA PO Take by mouth.  Marland Kitchen ibuprofen (ADVIL,MOTRIN) 200 MG tablet Take 200 mg by mouth every 6 (six) hours as needed for fever, headache or mild pain.  . indapamide (LOZOL) 1.25 MG tablet Take 1.25 mg by mouth daily.  . meloxicam (MOBIC) 15 MG tablet Take 1 tablet by mouth as needed.  . Multiple Vitamins-Minerals (MULTIVITAMIN PO) Take 1 tablet by mouth daily.  Maudry Mayhew AD 4-10-325 MG TABS Take 1 tablet by mouth as needed.  . [DISCONTINUED] diphenhydrAMINE (BENADRYL) 25 mg capsule Take 50 mg by mouth every 6 (six) hours as needed.  . [DISCONTINUED] echothiophate 0.125 % ophthalmic solution 1 drop.  . [DISCONTINUED] hydrochlorothiazide (HYDRODIURIL) 12.5 MG tablet Take 12.5 mg by mouth daily.  . [DISCONTINUED] hydrochlorothiazide (HYDRODIURIL) 25 MG tablet Take 1 tablet (25 mg total) by mouth daily.  . [DISCONTINUED] hydrochlorothiazide (HYDRODIURIL) 25 MG tablet Take 1 tablet (25 mg total) by mouth daily.   No facility-administered encounter medications on file as of 07/16/2019.     REVIEW OF SYSTEMS  :  All other systems reviewed and negative except where noted in the History of Present Illness.   PHYSICAL EXAM: BP (!) 142/86 (BP Location: Left Arm, Patient Position: Sitting, Cuff Size: Normal)   Pulse 84   Ht 5' 3.5" (1.613 m) Comment: height measured without shoes  Wt 167 lb (75.8 kg)   BMI 29.12 kg/m  General: Well developed AA female in no acute distress Head: Normocephalic and atraumatic Eyes:  Sclerae anicteric, conjunctiva pink. Ears: Normal auditory acuity Lungs: Clear throughout to auscultation; no increased WOB. Heart: Regular rate and rhythm; no  M/R/G. Abdomen: Soft, non-distended.  BS present.  Mild diffuse lower abdominal TTP. Rectal:  Will be done at the time of colonoscopy. Musculoskeletal: Symmetrical with no gross deformities  Skin: No lesions on visible extremities Extremities: No edema  Neurological: Alert oriented x 4, grossly non-focal Psychological:  Alert and cooperative. Normal mood and affect  ASSESSMENT AND PLAN: *59 year old female with complaints of generalized abdominal bloating/abdominal pain and gas with change in bowel habits over the past year.  Had an ultrasound performed per GYN that reported a "distended colon".  Over the past year has just developed more frequent bowel habits.  She says that she will pass stool each time she sits down to urinate.  Has what sounds like incomplete evacuation of stool.  She tried Metamucil powder for a short time and did notice a difference, but admits that she is not good about being consistent with stuff like that ongoingly.  I agree that that is a good idea to help create bulk to her stools to help eliminate more at one time.  Last colonoscopy 6.5 to 7 years ago by Dr. Randa Evens.  We will try to get those records, but I think that we need to plan for repeat procedure.  This is scheduled Dr. Adela Lank.  The risks, benefits, and alternatives to colonoscopy were discussed with the patient and she consents to proceed.   CC:  Cain Saupe, MD CC:  Arlie Solomons, NP,

## 2019-07-16 NOTE — Patient Instructions (Signed)
If you are age 59 or older, your body mass index should be between 23-30. Your Body mass index is 29.12 kg/m. If this is out of the aforementioned range listed, please consider follow up with your Primary Care Provider.  If you are age 60 or younger, your body mass index should be between 19-25. Your Body mass index is 29.12 kg/m. If this is out of the aformentioned range listed, please consider follow up with your Primary Care Provider.   You have been scheduled for a colonoscopy. Please follow written instructions given to you at your visit today.  Please pick up your prep supplies at the pharmacy within the next 1-3 days. If you use inhalers (even only as needed), please bring them with you on the day of your procedure. Your physician has requested that you go to www.startemmi.com and enter the access code given to you at your visit today. This web site gives a general overview about your procedure. However, you should still follow specific instructions given to you by our office regarding your preparation for the procedure.  Due to recent changes in healthcare laws, you may see the results of your imaging and laboratory studies on MyChart before your provider has had a chance to review them.  We understand that in some cases there may be results that are confusing or concerning to you. Not all laboratory results come back in the same time frame and the provider may be waiting for multiple results in order to interpret others.  Please give [place] 48 hours in order for your provider to thoroughly review all the results before contacting the office for clarification of your results.

## 2019-07-17 NOTE — Progress Notes (Signed)
Agree with assessment and plan as outlined.  

## 2019-08-04 ENCOUNTER — Encounter: Payer: Self-pay | Admitting: Gastroenterology

## 2019-08-06 ENCOUNTER — Encounter: Payer: Self-pay | Admitting: Gastroenterology

## 2019-08-06 ENCOUNTER — Ambulatory Visit (AMBULATORY_SURGERY_CENTER): Payer: BC Managed Care – PPO | Admitting: Gastroenterology

## 2019-08-06 ENCOUNTER — Other Ambulatory Visit: Payer: Self-pay

## 2019-08-06 VITALS — BP 155/87 | HR 86 | Temp 97.1°F | Resp 27 | Ht 63.0 in | Wt 167.0 lb

## 2019-08-06 DIAGNOSIS — Z1211 Encounter for screening for malignant neoplasm of colon: Secondary | ICD-10-CM | POA: Diagnosis not present

## 2019-08-06 DIAGNOSIS — K5730 Diverticulosis of large intestine without perforation or abscess without bleeding: Secondary | ICD-10-CM

## 2019-08-06 DIAGNOSIS — R194 Change in bowel habit: Secondary | ICD-10-CM

## 2019-08-06 MED ORDER — SODIUM CHLORIDE 0.9 % IV SOLN: 500.0000 mL | Freq: Once | INTRAVENOUS | Status: DC

## 2019-08-06 NOTE — Op Note (Signed)
[facility] Patient Name: Jade Guzman Procedure Date: 08/06/2019 11:28 AM MRN: [id number] Endoscopist: Viviann Spare P. Adela Lank , MD Age: 59 Referring MD:  Date of Birth: 19-Jul-1960 Gender: Female Account #: [id number] Procedure:                Colonoscopy Indications:              Change in bowel habits, abdominal discomfort. Medicines:                Monitored Anesthesia Care Procedure:                Pre-Anesthesia Assessment:                           - Prior to the procedure, a History and Physical                            was performed, and patient medications and                            allergies were reviewed. The patient's tolerance of                            previous anesthesia was also reviewed. The risks                            and benefits of the procedure and the sedation                            options and risks were discussed with the patient.                            All questions were answered, and informed consent                            was obtained. Prior Anticoagulants: The patient has                            taken no previous anticoagulant or antiplatelet                            agents. ASA Grade Assessment: II - A patient with                            mild systemic disease. After reviewing the risks                            and benefits, the patient was deemed in                            satisfactory condition to undergo the procedure.                           After obtaining informed consent, the colonoscope  was passed under direct vision. Throughout the                            procedure, the patient's blood pressure, pulse, and                            oxygen saturations were monitored continuously. The                            Colonoscope was introduced through the anus and                            advanced to the the terminal ileum, with                            identification of the  appendiceal orifice and IC                            valve. The colonoscopy was performed without                            difficulty. The patient tolerated the procedure                            well. The quality of the bowel preparation was                            adequate. The terminal ileum, ileocecal valve,                            appendiceal orifice, and rectum were photographed. Scope In: 11:40:36 AM Scope Out: 12:03:57 PM Scope Withdrawal Time: 0 hours 20 minutes 9 seconds  Total Procedure Duration: 0 hours 23 minutes 21 seconds  Findings:                 The perianal and digital rectal examinations were                            normal.                           The terminal ileum appeared normal.                           A few small-mouthed diverticula were found in the                            transverse colon.                           The exam was otherwise without abnormality. No                            polyps. Complications:            No immediate complications. Estimated blood loss:  None. Estimated Blood Loss:     Estimated blood loss: none. Impression:               - The examined portion of the ileum was normal.                           - Diverticulosis in the transverse colon.                           - The examination was otherwise normal.                           - No polyps, inflammatory changes, or stenosis                            /stricture. Recommendation:           - Patient has a contact number available for                            emergencies. The signs and symptoms of potential                            delayed complications were discussed with the                            patient. Return to normal activities tomorrow.                            Written discharge instructions were provided to the                            patient.                           - Resume previous diet.                            - Continue present medications.                           - Trial of Citrucel once daily, consideration for                            bentyl as needed. Follow up in the office if                            symptoms persist despite these measures.                           - Repeat colonoscopy in 10 years for screening                            purposes. Viviann Spare P. Arabel Barcenas, MD 08/06/2019 12:11:58 PM This report has been signed electronically.

## 2019-08-06 NOTE — Progress Notes (Signed)
VS by WR 

## 2019-08-06 NOTE — Patient Instructions (Signed)
Thank you for letting us take care of your healthcare needs today.   YOU HAD AN ENDOSCOPIC PROCEDURE TODAY AT THE [facility]:   Refer to the procedure report that was given to you for any specific questions about what was found during the examination.  If the procedure report does not answer your questions, please call your gastroenterologist to clarify.  If you requested that your care partner not be given the details of your procedure findings, then the procedure report has been included in a sealed envelope for you to review at your convenience later.  YOU SHOULD EXPECT: Some feelings of bloating in the abdomen. Passage of more gas than usual.  Walking can help get rid of the air that was put into your GI tract during the procedure and reduce the bloating. If you had a lower endoscopy (such as a colonoscopy or flexible sigmoidoscopy) you may notice spotting of blood in your stool or on the toilet paper. If you underwent a bowel prep for your procedure, you may not have a normal bowel movement for a few days.  Please Note:  You might notice some irritation and congestion in your nose or some drainage.  This is from the oxygen used during your procedure.  There is no need for concern and it should clear up in a day or so.  SYMPTOMS TO REPORT IMMEDIATELY:  Following lower endoscopy (colonoscopy or flexible sigmoidoscopy):  Excessive amounts of blood in the stool  Significant tenderness or worsening of abdominal pains  Swelling of the abdomen that is new, acute  Fever of 100F or higher  For urgent or emergent issues, a gastroenterologist can be reached at any hour by calling [phone]. Do not use MyChart messaging for urgent concerns.    DIET:  We do recommend a small meal at first, but then you may proceed to your regular diet.  Drink plenty of fluids but you should avoid alcoholic beverages for 24 hours.  ACTIVITY:  You should plan to take it easy for the rest of today and  you should NOT DRIVE or use heavy machinery until tomorrow (because of the sedation medicines used during the test).    FOLLOW UP: Our staff will call the number listed on your records 48-72 hours following your procedure to check on you and address any questions or concerns that you may have regarding the information given to you following your procedure. If we do not reach you, we will leave a message.  We will attempt to reach you two times.  During this call, we will ask if you have developed any symptoms of COVID 19. If you develop any symptoms (ie: fever, flu-like symptoms, shortness of breath, cough etc.) before then, please call [phone].  If you test positive for Covid 19 in the 2 weeks post procedure, please call and report this information to us.    If any biopsies were taken you will be contacted by phone or by letter within the next 1-3 weeks.  Please call us at [phone] if you have not heard about the biopsies in 3 weeks.    SIGNATURES/CONFIDENTIALITY: You and/or your care partner have signed paperwork which will be entered into your electronic medical record.  These signatures attest to the fact that that the information above on your After Visit Summary has been reviewed and is understood.  Full responsibility of the confidentiality of this discharge information lies with you and/or your care-partner.  

## 2019-08-06 NOTE — Progress Notes (Signed)
Report to PACU, RN, vss, BBS= Clear.  

## 2019-08-08 ENCOUNTER — Telehealth: Payer: Self-pay

## 2019-08-08 NOTE — Telephone Encounter (Signed)
  Follow up Call-  Call back number 08/06/2019  Post procedure Call Back phone  # [phone]  Permission to leave phone message Yes  Some recent data might be hidden     Patient questions:  Do you have a fever, pain , or abdominal swelling? No. Pain Score  0 *  Have you tolerated food without any problems? Yes.    Have you been able to return to your normal activities? Yes.    Do you have any questions about your discharge instructions: Diet   No. Medications  No. Follow up visit  No.  Do you have questions or concerns about your Care? No.  Actions: * If pain score is 4 or above: No action needed, pain <4.  1. Have you developed a fever since your procedure? No  2.   Have you had an respiratory symptoms (SOB or cough) since your procedure? No  3.   Have you tested positive for COVID 19 since your procedure No 4.   Have you had any family members/close contacts diagnosed with the COVID 19 since your procedure?  No   If yes to any of these questions please route to Laverna Peace, RN and Charlett Lango, RN

## 2019-10-31 DIAGNOSIS — R0683 Snoring: Secondary | ICD-10-CM | POA: Diagnosis not present

## 2019-10-31 DIAGNOSIS — M130 Polyarthritis, unspecified: Secondary | ICD-10-CM | POA: Diagnosis not present

## 2019-10-31 DIAGNOSIS — G4730 Sleep apnea, unspecified: Secondary | ICD-10-CM | POA: Diagnosis not present

## 2019-10-31 DIAGNOSIS — I10 Essential (primary) hypertension: Secondary | ICD-10-CM | POA: Diagnosis not present

## 2019-11-11 DIAGNOSIS — Z1231 Encounter for screening mammogram for malignant neoplasm of breast: Secondary | ICD-10-CM | POA: Diagnosis not present

## 2019-11-18 ENCOUNTER — Other Ambulatory Visit: Payer: Self-pay | Admitting: Obstetrics & Gynecology

## 2019-11-18 DIAGNOSIS — R928 Other abnormal and inconclusive findings on diagnostic imaging of breast: Secondary | ICD-10-CM

## 2019-11-21 ENCOUNTER — Ambulatory Visit
Admission: RE | Admit: 2019-11-21 | Discharge: 2019-11-21 | Disposition: A | Discharge status: Home / Self Care | Payer: BC Managed Care – PPO | Source: Ambulatory Visit | Attending: Obstetrics & Gynecology | Admitting: Obstetrics & Gynecology

## 2019-11-21 ENCOUNTER — Ambulatory Visit: Payer: BC Managed Care – PPO

## 2019-11-21 ENCOUNTER — Other Ambulatory Visit: Payer: Self-pay

## 2019-11-21 DIAGNOSIS — R928 Other abnormal and inconclusive findings on diagnostic imaging of breast: Secondary | ICD-10-CM

## 2019-12-01 ENCOUNTER — Other Ambulatory Visit: Payer: BC Managed Care – PPO

## 2020-03-03 DIAGNOSIS — M130 Polyarthritis, unspecified: Secondary | ICD-10-CM | POA: Diagnosis not present

## 2020-03-03 DIAGNOSIS — R635 Abnormal weight gain: Secondary | ICD-10-CM | POA: Diagnosis not present

## 2020-03-03 DIAGNOSIS — I10 Essential (primary) hypertension: Secondary | ICD-10-CM | POA: Diagnosis not present

## 2020-03-05 DIAGNOSIS — R635 Abnormal weight gain: Secondary | ICD-10-CM | POA: Diagnosis not present

## 2020-03-05 DIAGNOSIS — I10 Essential (primary) hypertension: Secondary | ICD-10-CM | POA: Diagnosis not present

## 2020-03-05 DIAGNOSIS — G4730 Sleep apnea, unspecified: Secondary | ICD-10-CM | POA: Diagnosis not present

## 2020-03-05 DIAGNOSIS — G4700 Insomnia, unspecified: Secondary | ICD-10-CM | POA: Diagnosis not present

## 2020-05-03 DIAGNOSIS — M25511 Pain in right shoulder: Secondary | ICD-10-CM | POA: Diagnosis not present

## 2020-05-28 DIAGNOSIS — M25511 Pain in right shoulder: Secondary | ICD-10-CM | POA: Diagnosis not present

## 2020-07-01 DIAGNOSIS — M25511 Pain in right shoulder: Secondary | ICD-10-CM | POA: Diagnosis not present

## 2020-07-01 DIAGNOSIS — M25611 Stiffness of right shoulder, not elsewhere classified: Secondary | ICD-10-CM | POA: Diagnosis not present

## 2020-07-01 DIAGNOSIS — M7541 Impingement syndrome of right shoulder: Secondary | ICD-10-CM | POA: Diagnosis not present

## 2020-07-13 DIAGNOSIS — M25611 Stiffness of right shoulder, not elsewhere classified: Secondary | ICD-10-CM | POA: Diagnosis not present

## 2020-07-13 DIAGNOSIS — M7541 Impingement syndrome of right shoulder: Secondary | ICD-10-CM | POA: Diagnosis not present

## 2020-07-13 DIAGNOSIS — M25511 Pain in right shoulder: Secondary | ICD-10-CM | POA: Diagnosis not present

## 2020-07-20 DIAGNOSIS — M7541 Impingement syndrome of right shoulder: Secondary | ICD-10-CM | POA: Diagnosis not present

## 2020-07-20 DIAGNOSIS — M25511 Pain in right shoulder: Secondary | ICD-10-CM | POA: Diagnosis not present

## 2020-07-20 DIAGNOSIS — M25611 Stiffness of right shoulder, not elsewhere classified: Secondary | ICD-10-CM | POA: Diagnosis not present

## 2020-07-30 DIAGNOSIS — M25611 Stiffness of right shoulder, not elsewhere classified: Secondary | ICD-10-CM | POA: Diagnosis not present

## 2020-07-30 DIAGNOSIS — M7541 Impingement syndrome of right shoulder: Secondary | ICD-10-CM | POA: Diagnosis not present

## 2020-07-30 DIAGNOSIS — M25511 Pain in right shoulder: Secondary | ICD-10-CM | POA: Diagnosis not present

## 2020-09-24 DIAGNOSIS — M67911 Unspecified disorder of synovium and tendon, right shoulder: Secondary | ICD-10-CM | POA: Diagnosis not present

## 2020-10-14 DIAGNOSIS — M25511 Pain in right shoulder: Secondary | ICD-10-CM | POA: Diagnosis not present

## 2020-10-20 DIAGNOSIS — M75121 Complete rotator cuff tear or rupture of right shoulder, not specified as traumatic: Secondary | ICD-10-CM | POA: Diagnosis not present

## 2020-11-05 DIAGNOSIS — I10 Essential (primary) hypertension: Secondary | ICD-10-CM | POA: Diagnosis not present

## 2020-11-18 DIAGNOSIS — I10 Essential (primary) hypertension: Secondary | ICD-10-CM | POA: Diagnosis not present

## 2020-11-18 DIAGNOSIS — M130 Polyarthritis, unspecified: Secondary | ICD-10-CM | POA: Diagnosis not present

## 2020-11-18 DIAGNOSIS — R635 Abnormal weight gain: Secondary | ICD-10-CM | POA: Diagnosis not present

## 2020-11-18 DIAGNOSIS — G4700 Insomnia, unspecified: Secondary | ICD-10-CM | POA: Diagnosis not present

## 2020-12-21 DIAGNOSIS — M75101 Unspecified rotator cuff tear or rupture of right shoulder, not specified as traumatic: Secondary | ICD-10-CM | POA: Diagnosis not present

## 2020-12-21 DIAGNOSIS — M75121 Complete rotator cuff tear or rupture of right shoulder, not specified as traumatic: Secondary | ICD-10-CM | POA: Diagnosis not present

## 2020-12-21 DIAGNOSIS — M7541 Impingement syndrome of right shoulder: Secondary | ICD-10-CM | POA: Diagnosis not present

## 2020-12-21 DIAGNOSIS — M24111 Other articular cartilage disorders, right shoulder: Secondary | ICD-10-CM | POA: Diagnosis not present

## 2020-12-21 DIAGNOSIS — G8918 Other acute postprocedural pain: Secondary | ICD-10-CM | POA: Diagnosis not present

## 2022-03-21 IMAGING — MG MM DIGITAL DIAGNOSTIC UNILAT*L* W/ TOMO W/ CAD
6 series · 6 of 18 positions shown · non-contrast
Comparison: Previous exam(s).

CLINICAL DATA: 59-year-old female recalled from screening mammogram
dated 11/11/2019 for a possible left breast asymmetry.

EXAM:
DIGITAL DIAGNOSTIC UNILATERAL LEFT MAMMOGRAM WITH TOMO AND CAD

[L CC synth-2D (1 of 2)]
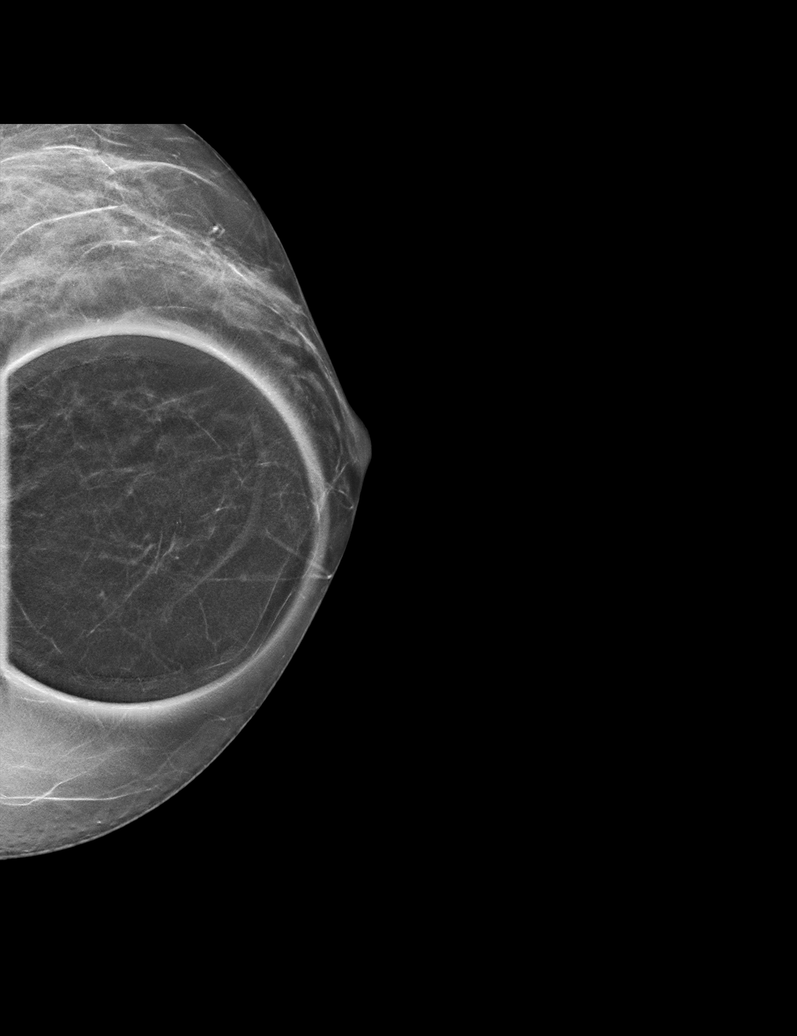

[L CC synth-2D (2 of 2)]
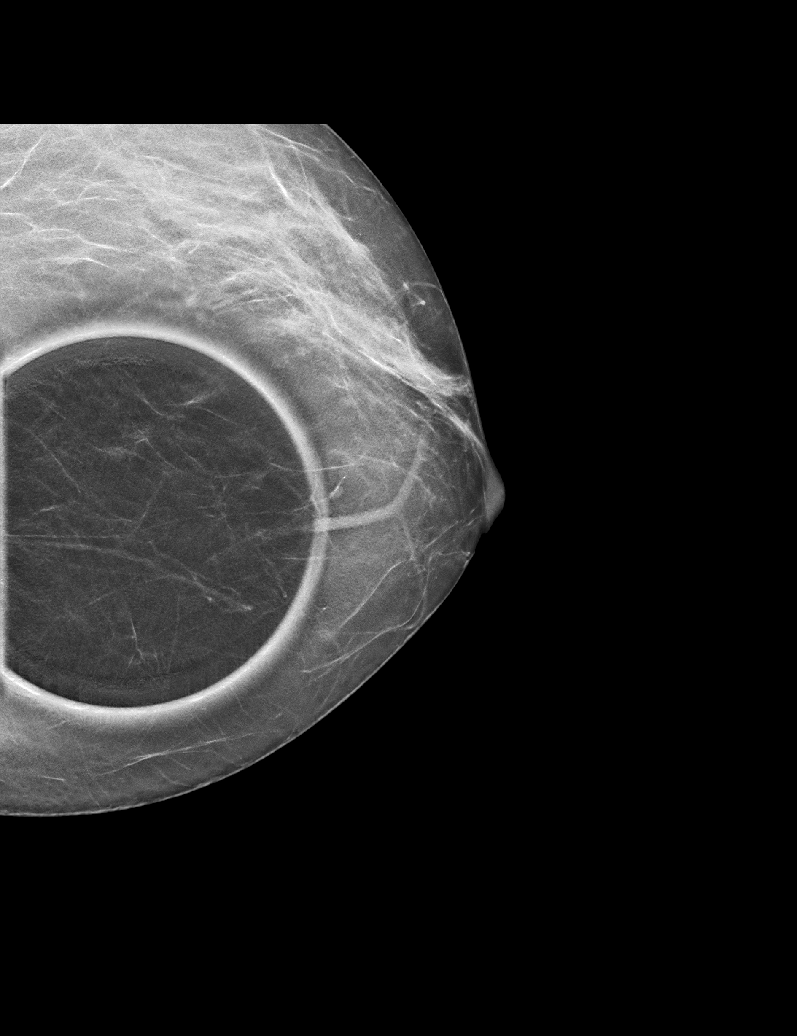

[L ML synth-2D]
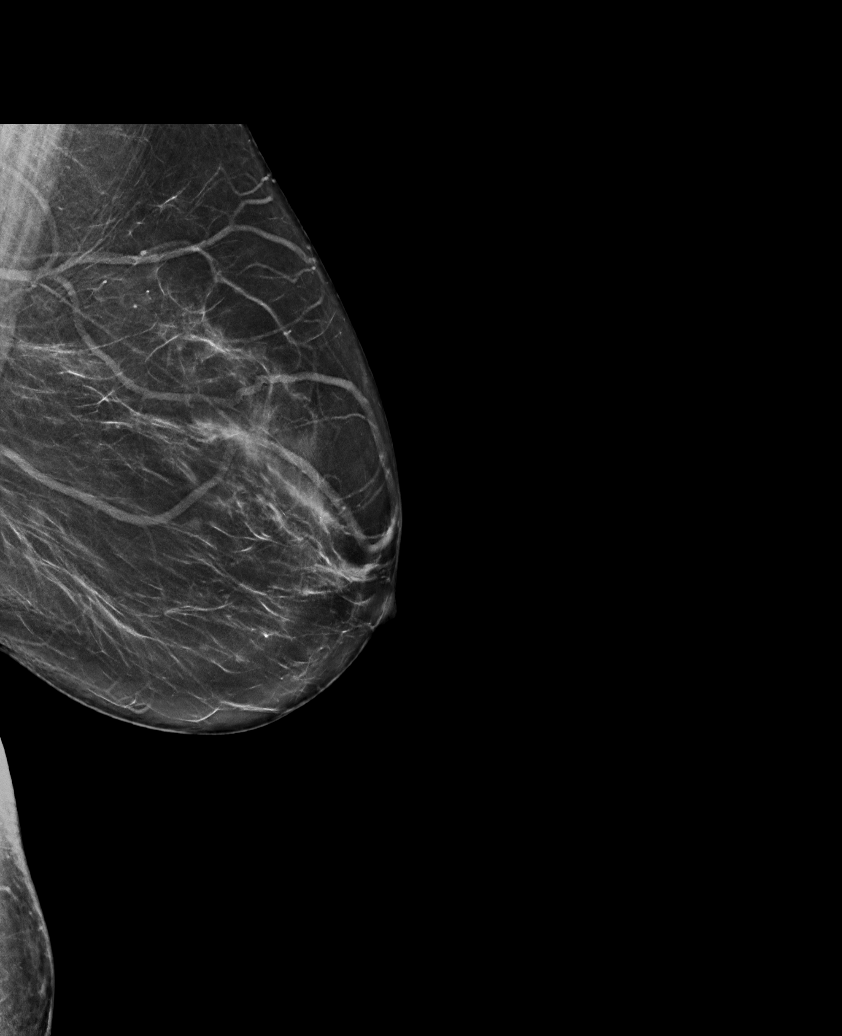

[L ML tomo · tomo slice 35/69.0]
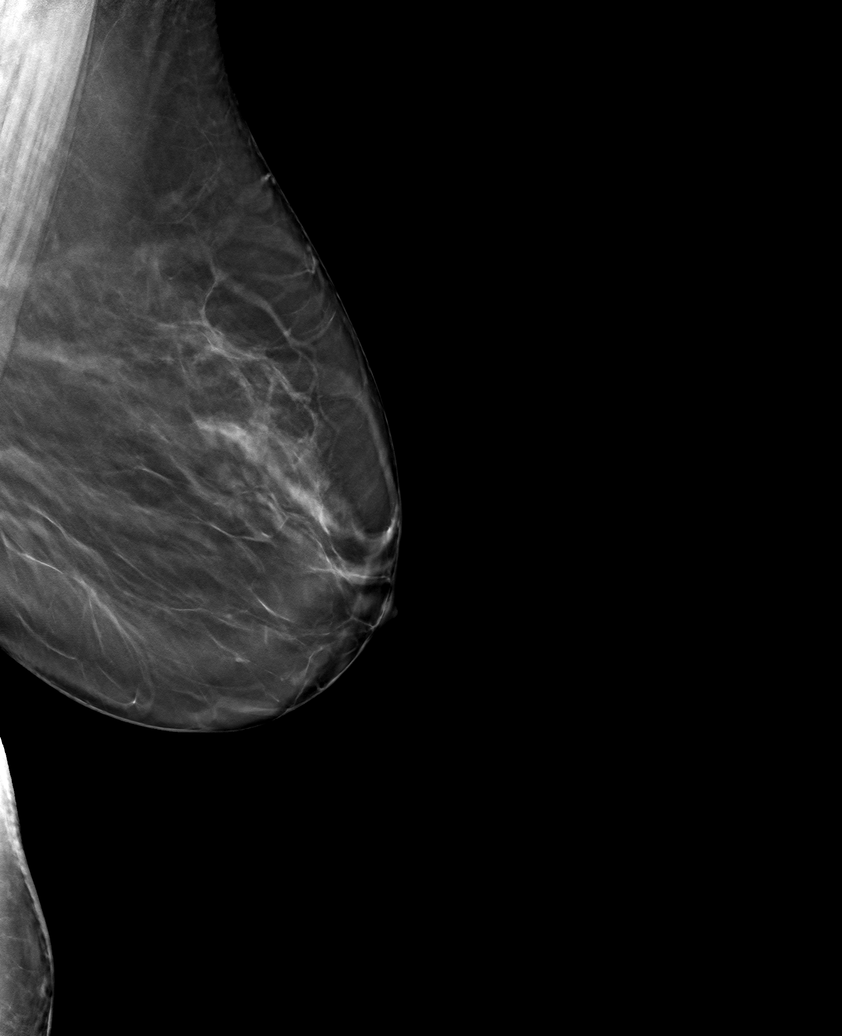

[L CC tomo (1 of 2) · tomo slice 21/42.0]
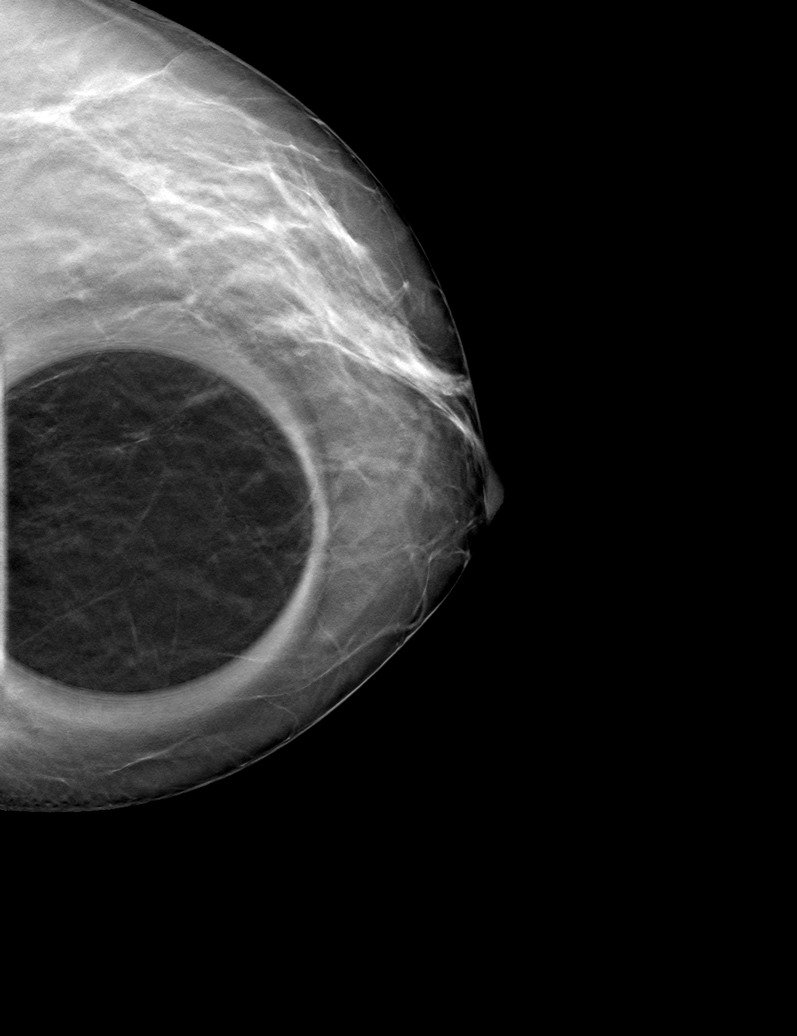

[L CC tomo (2 of 2) · tomo slice 19/38.0]
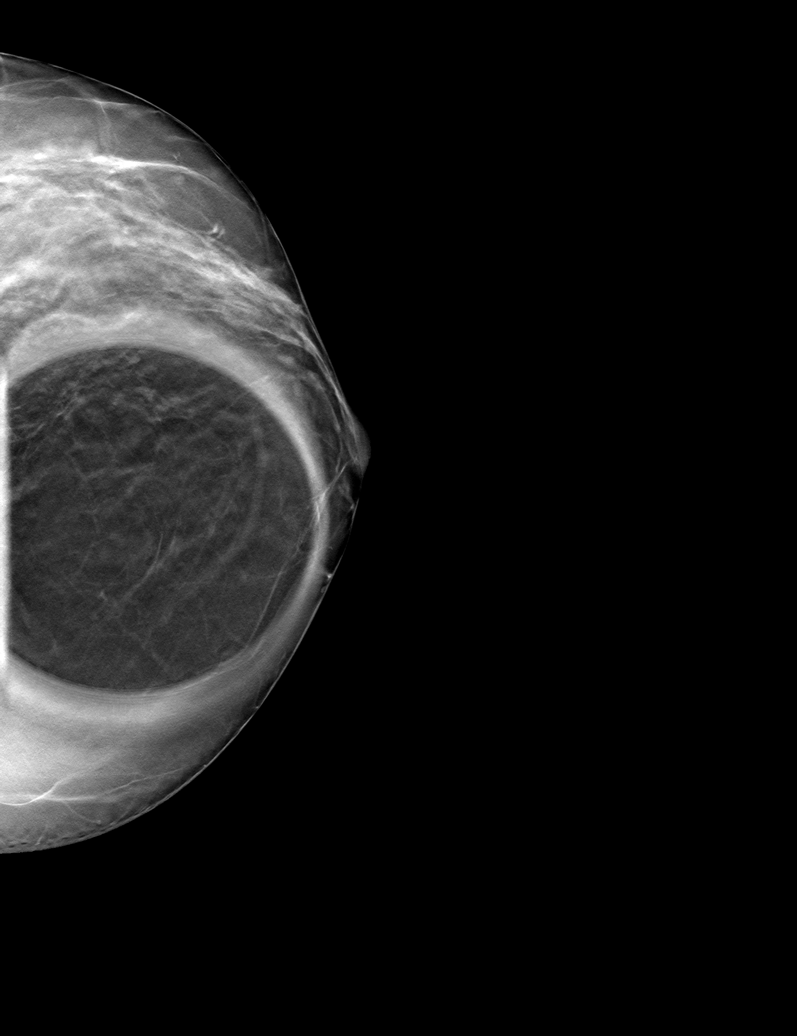

[6 of 18 positions shown; findings below may reference images not displayed]

ACR Breast Density Category b: There are scattered areas of
fibroglandular density.
FINDINGS: Previously described, possible asymmetry central left breast at
middle depth is seen on the cc projection only resolves into well
dispersed fibroglandular tissue on today's additional views. No
suspicious findings are identified.

Mammographic images were processed with CAD.
IMPRESSION: No mammographic evidence of malignancy.

RECOMMENDATION:
Screening mammogram in one year.(Code:[id number]-[id number])

I have discussed the findings and recommendations with the patient.
If applicable, a reminder letter will be sent to the patient
regarding the next appointment.

BI-RADS CATEGORY  1: Negative.
